# Patient Record
Sex: Female | Born: 1995 | Race: White | Hispanic: No | Marital: Single | Smoking: Never smoker
Health system: Southern US, Community
[De-identification: ages and names within clinical notes are randomized; demographics above are authoritative.]

## PROBLEM LIST (undated history)

## (undated) DIAGNOSIS — F329 Major depressive disorder, single episode, unspecified: Secondary | ICD-10-CM

## (undated) DIAGNOSIS — F988 Other specified behavioral and emotional disorders with onset usually occurring in childhood and adolescence: Secondary | ICD-10-CM

## (undated) DIAGNOSIS — F79 Unspecified intellectual disabilities: Secondary | ICD-10-CM

## (undated) DIAGNOSIS — F4010 Social phobia, unspecified: Secondary | ICD-10-CM

## (undated) DIAGNOSIS — F419 Anxiety disorder, unspecified: Secondary | ICD-10-CM

## (undated) DIAGNOSIS — F32A Depression, unspecified: Secondary | ICD-10-CM

## (undated) DIAGNOSIS — Z23 Encounter for immunization: Secondary | ICD-10-CM

## (undated) HISTORY — PX: WISDOM TOOTH EXTRACTION: SHX21

## (undated) HISTORY — DX: Social phobia, unspecified: F40.10

## (undated) HISTORY — DX: Anxiety disorder, unspecified: F41.9

## (undated) HISTORY — DX: Encounter for immunization: Z23

## (undated) HISTORY — DX: Depression, unspecified: F32.A

## (undated) HISTORY — DX: Major depressive disorder, single episode, unspecified: F32.9

## (undated) HISTORY — DX: Unspecified intellectual disabilities: F79

## (undated) HISTORY — PX: APPENDECTOMY: SHX54

## (undated) HISTORY — DX: Other specified behavioral and emotional disorders with onset usually occurring in childhood and adolescence: F98.8

---
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
## 2015-02-02 ENCOUNTER — Encounter: Payer: Self-pay | Admitting: Emergency Medicine

## 2015-02-02 ENCOUNTER — Emergency Department
Admission: EM | Admit: 2015-02-02 | Discharge: 2015-02-02 | Disposition: A | Discharge status: Home / Self Care | Payer: Medicaid Other | Attending: Emergency Medicine | Admitting: Emergency Medicine

## 2015-02-02 DIAGNOSIS — Z3202 Encounter for pregnancy test, result negative: Secondary | ICD-10-CM | POA: Insufficient documentation

## 2015-02-02 LAB — POCT PREGNANCY, URINE: PREG TEST UR: NEGATIVE

## 2015-02-02 NOTE — ED Provider Notes (Signed)
Shannon Baker  ____________________________________________  Time seen: Approximately 11:32 PM  I have reviewed the triage vital signs and the nursing notes.   HISTORY  Chief Complaint Possible Pregnancy    HPI Shannon Baker is a 19 y.o. female who presents emergency department desiring a pregnancy test. She states that she took a home pregnancy test which was positive and would like retesting here.The patient reports that she has the Implanon which is been in place since March. This was a replacement for her previous Implanon. She states that she has only had 2 menstrual cycles since March and cannot remember last period. She states that she has unprotected sex and took a routine pregnancy test. The pregnancy test at home was positive and she wanted a repeat testing here to confirm. Patient denies any abdominal pain, nausea vomiting, vaginal discharge, dysuria, polyuria, hematuria, flank pain, fevers or chills.   History reviewed. No pertinent past medical history.  There are no active problems to display for this patient.   Past Surgical History  Procedure Laterality Date  . Appendectomy      No current outpatient prescriptions on file.  Allergies Review of patient's allergies indicates no known allergies.  No family history on file.  Social History Social History  Substance Use Topics  . Smoking status: Never Smoker   . Smokeless tobacco: None  . Alcohol Use: No    Review of Systems Constitutional: No fever/chills Eyes: No visual changes. ENT: No sore throat. Cardiovascular: Denies chest pain. Respiratory: Denies shortness of breath. Gastrointestinal: No abdominal pain.  No nausea, no vomiting.  No diarrhea.  No constipation. Genitourinary: Negative for dysuria. Musculoskeletal: Negative for back pain. Skin: Negative for rash. Neurological: Negative for headaches, focal weakness or  numbness.  10-point ROS otherwise negative.  ____________________________________________   PHYSICAL EXAM:  VITAL SIGNS: ED Triage Vitals  Enc Vitals Group     BP 02/02/15 2250 131/89 mmHg     Pulse Rate 02/02/15 2250 103     Resp 02/02/15 2250 20     Temp 02/02/15 2250 99.2 F (37.3 C)     Temp Source 02/02/15 2250 Oral     SpO2 02/02/15 2250 100 %     Weight 02/02/15 2250 133 lb (60.328 kg)     Height 02/02/15 2250  (1.626 m)     Head Cir --      Peak Flow --      Pain Score --      Pain Loc --      Pain Edu? --      Excl. in GC? --     Constitutional: Alert and oriented. Well appearing and in no acute distress. Eyes: Conjunctivae are normal. PERRL. EOMI. Head: Atraumatic. Nose: No congestion/rhinnorhea. Mouth/Throat: Mucous membranes are moist.  Oropharynx non-erythematous. Neck: No stridor.   Cardiovascular: Normal rate, regular rhythm. Grossly normal heart sounds.  Good peripheral circulation. Respiratory: Normal respiratory effort.  No retractions. Lungs CTAB. Gastrointestinal: Soft and nontender. No distention. No abdominal bruits. No CVA tenderness. Musculoskeletal: No lower extremity tenderness nor edema.  No joint effusions. Neurologic:  Normal speech and language. No gross focal neurologic deficits are appreciated. No gait instability. Skin:  Skin is warm, dry and intact. No rash noted. Psychiatric: Mood and affect are normal. Speech and behavior are normal.  ____________________________________________   LABS (all labs ordered are listed, but only abnormal results are displayed)  Labs Reviewed  POCT PREGNANCY, URINE  ____________________________________________  EKG   ____________________________________________  RADIOLOGY   ____________________________________________   PROCEDURES  Procedure(s) performed: None  Critical Care performed: No  ____________________________________________   INITIAL IMPRESSION / ASSESSMENT AND PLAN  / ED COURSE  Pertinent labs & imaging results that were available during my care of the patient were reviewed by me and considered in my medical decision making (see chart for details).  The patient's history, symptoms, physical exam were taken into consideration as well as laboratory results. The patient is informed that pregnancy test here in the emergency department is negative. The patient is to follow-up with OB/GYN for any further concerns. Patient denies any other symptoms. Patient discharged home. ____________________________________________   FINAL CLINICAL IMPRESSION(S) / ED DIAGNOSES  Final diagnoses:  Encounter for pregnancy test with result negative      Shannon PatchesJonathan D Macel Yearsley, PA-C 02/02/15 [id number]  Phineas SemenGraydon Goodman, MD 02/03/15 [phone]

## 2015-02-02 NOTE — Discharge Instructions (Signed)
Contraceptive Implant Information  A contraceptive implant is a plastic rod that is inserted under your skin. It is usually inserted under the skin of your upper arm. It continually releases small amounts of progestin (synthetic progesterone) into your bloodstream. This prevents an egg from being released from your ovaries. It also thickens your cervical mucus to prevent sperm from entering the cervix, and it thins your uterine lining to prevent a fertilized egg from attaching to your uterus. Contraceptive implants can be effective for up to 3 years. They do not provide protection against sexually transmitted diseases (STDs).   The procedure to insert an implant usually takes about 10 minutes. There may be minor bruising, swelling, and discomfort at the insertion site for a couple days. The implant begins to work within the first day. Other contraceptive protection may be necessary for 7 days. Be sure to discuss with your health care provider if you need a backup method of contraception.   Your health care provider will make sure you are a good candidate for the contraceptive implant. Discuss with your health care provider the possible side effects of the implant.  ADVANTAGES  · It prevents pregnancy for up to 3 years.  · It is easily reversible.  · It is convenient.  · It can be used when breastfeeding.  · It can be used by women who cannot take estrogen.  DISADVANTAGES  · You may have irregular or unplanned vaginal bleeding.  · You may develop side effects, including headache, weight gain, acne, breast tenderness, or mood changes.  · You may have tissue or nerve damage after insertion (rare).  · It may be difficult and uncomfortable to remove.  · Certain medicines may interfere with the effectiveness of the implant.   REMOVAL OF IMPLANT  The implant should be removed in 3 years or as directed by your health care provider. The implant's effect wears off in a few hours after removal. Your ability to get pregnant  (fertility) may be restored in 1-2 weeks. A new implant can be inserted as soon as the old one is removed if desired.  CONTRAINDICATIONS  You should not get the implant if you are experiencing any of the following situations:  · You are pregnant.  · You have a history of breast cancer, osteoporosis, blood clots, heart disease, diabetes, high blood pressure, liver disease, tumors, or stroke.    · You have undiagnosed vaginal bleeding.  · You have a sensitivity to any part of the implant.     This information is not intended to replace advice given to you by your health care provider. Make sure you discuss any questions you have with your health care provider.     Document Released: 02/23/2011 Document Revised: 11/06/2012 Document Reviewed: 09/02/2012  Elsevier Interactive Patient Education ©2016 [employer]

## 2015-02-02 NOTE — ED Notes (Signed)
Pt dc home ambulatory denies pain instructed on follow up plan PT NAD AT DC 

## 2015-02-02 NOTE — ED Notes (Signed)
Patient ambulatory to triage with steady gait, without difficulty or distress noted; pt reports wanting a pregnancy test; st took one at home that was positive and wants another; denies any c/o; pt informed that the testing we do here is the same as her home pregnancy test but cont to st she would like another

## 2016-01-02 ENCOUNTER — Emergency Department
Admission: EM | Admit: 2016-01-02 | Discharge: 2016-01-02 | Disposition: A | Discharge status: Home / Self Care | Payer: Medicaid Other | Attending: Emergency Medicine | Admitting: Emergency Medicine

## 2016-01-02 DIAGNOSIS — R300 Dysuria: Secondary | ICD-10-CM | POA: Diagnosis present

## 2016-01-02 DIAGNOSIS — N12 Tubulo-interstitial nephritis, not specified as acute or chronic: Secondary | ICD-10-CM | POA: Insufficient documentation

## 2016-01-02 LAB — URINALYSIS COMPLETE WITH MICROSCOPIC (ARMC ONLY)
Bilirubin Urine: NEGATIVE
GLUCOSE, UA: NEGATIVE mg/dL
Hgb urine dipstick: NEGATIVE
Ketones, ur: NEGATIVE mg/dL
Nitrite: NEGATIVE
Protein, ur: 30 mg/dL — AB
Specific Gravity, Urine: 1.019 (ref 1.005–1.030)
pH: 6 (ref 5.0–8.0)

## 2016-01-02 LAB — BASIC METABOLIC PANEL
ANION GAP: 4 — AB (ref 5–15)
BUN: 15 mg/dL (ref 6–20)
CHLORIDE: 110 mmol/L (ref 101–111)
CO2: 25 mmol/L (ref 22–32)
Calcium: 9.1 mg/dL (ref 8.9–10.3)
Creatinine, Ser: 0.89 mg/dL (ref 0.44–1.00)
Glucose, Bld: 114 mg/dL — ABNORMAL HIGH (ref 65–99)
POTASSIUM: 3.7 mmol/L (ref 3.5–5.1)
SODIUM: 139 mmol/L (ref 135–145)

## 2016-01-02 LAB — CBC
HEMATOCRIT: 42.1 % (ref 35.0–47.0)
Hemoglobin: 14.8 g/dL (ref 12.0–16.0)
MCH: 31.9 pg (ref 26.0–34.0)
MCHC: 35.2 g/dL (ref 32.0–36.0)
MCV: 90.6 fL (ref 80.0–100.0)
Platelets: 268 10*3/uL (ref 150–440)
RBC: 4.64 MIL/uL (ref 3.80–5.20)
RDW: 12.8 % (ref 11.5–14.5)
WBC: 9.9 10*3/uL (ref 3.6–11.0)

## 2016-01-02 LAB — POCT PREGNANCY, URINE: Preg Test, Ur: NEGATIVE

## 2016-01-02 MED ORDER — ONDANSETRON HCL 4 MG/2ML IJ SOLN
4.0000 mg | Freq: Once | INTRAMUSCULAR | Status: AC
  Administered 2016-01-02: 4 mg via INTRAVENOUS
  Filled 2016-01-02: qty 2

## 2016-01-02 MED ORDER — SODIUM CHLORIDE 0.9 % IV BOLUS (SEPSIS)
1000.0000 mL | Freq: Once | INTRAVENOUS | Status: AC
  Administered 2016-01-02: 1000 mL via INTRAVENOUS

## 2016-01-02 MED ORDER — KETOROLAC TROMETHAMINE 30 MG/ML IJ SOLN
30.0000 mg | Freq: Once | INTRAMUSCULAR | Status: AC
  Administered 2016-01-02: 30 mg via INTRAVENOUS
  Filled 2016-01-02: qty 1

## 2016-01-02 MED ORDER — SULFAMETHOXAZOLE-TRIMETHOPRIM 800-160 MG PO TABS: 1.0000 | ORAL_TABLET | Freq: Two times a day (BID) | ORAL | 0 refills | Status: AC

## 2016-01-02 MED ORDER — CEFTRIAXONE SODIUM 1 G IJ SOLR
1.0000 g | Freq: Once | INTRAMUSCULAR | Status: DC
  Filled 2016-01-02: qty 10

## 2016-01-02 MED ORDER — DEXTROSE 5 % IV SOLN: 1.0000 g | Freq: Once | INTRAVENOUS | Status: DC

## 2016-01-02 MED ORDER — CEFTRIAXONE SODIUM-DEXTROSE 1-3.74 GM-% IV SOLR
1.0000 g | Freq: Once | INTRAVENOUS | Status: AC
  Administered 2016-01-02: 1 g via INTRAVENOUS

## 2016-01-02 MED ORDER — CEFTRIAXONE SODIUM-DEXTROSE 1-3.74 GM-% IV SOLR
INTRAVENOUS | Status: AC
  Administered 2016-01-02: 1 g via INTRAVENOUS
  Filled 2016-01-02: qty 50

## 2016-01-02 NOTE — ED Notes (Signed)
Pt states flank pain x 2 weeks - with cloudy urine. Denies dysuria an frequency

## 2016-01-02 NOTE — ED Triage Notes (Signed)
Pt c/o right flank pain with painful urination for the past 3 weeks, states intermittent skant blood with urination.

## 2016-01-02 NOTE — ED Provider Notes (Signed)
[facility] Emergency Department Provider Note  ____________________________________________  Time seen: Approximately 4:53 PM  I have reviewed the triage vital signs and the nursing notes.   HISTORY  Chief Complaint Flank Pain   HPI Shannon Baker Debroah Loop is a 20 y.o. female with no significant past medical history who presents for evaluation of right flank pain and dysuria. Patient reports 4 weeks of dysuria and frequency and now with 2 weeks of right flank pain. Today she started having multiple episodes of nonbloody nonbilious emesis which prompted her to come to the emergency room. She currently endorses 8 out of 10 to a pain located in her right flank that is constant and nonradiating. She is also endorsing mild suprapubic pain. She denies any prior history of kidney stones. She has had no fever, no chills, no hematuria, no vaginal discharge, no vaginal bleeding.  History reviewed. No pertinent past medical history.  There are no active problems to display for this patient.   Past Surgical History:  Procedure Laterality Date  . APPENDECTOMY      Prior to Admission medications   Medication Sig Start Date End Date Taking? Authorizing Provider  sulfamethoxazole-trimethoprim (BACTRIM DS,SEPTRA DS) 800-160 MG tablet Take 1 tablet by mouth 2 (two) times daily. 01/02/16 01/12/16  Nita Sickle, MD    Allergies Review of patient's allergies indicates no known allergies.  No family history on file.  Social History Social History  Substance Use Topics  . Smoking status: Never Smoker  . Smokeless tobacco: Never Used  . Alcohol use No    Review of Systems  Constitutional: Negative for fever. Eyes: Negative for visual changes. ENT: Negative for sore throat. Cardiovascular: Negative for chest pain. Respiratory: Negative for shortness of breath. Gastrointestinal: Negative for abdominal pain,  Diarrhea. + vomiting Genitourinary: + dysuria and R  flank pain Musculoskeletal: Negative for back pain. Skin: Negative for rash. Neurological: Negative for headaches, weakness or numbness.  ____________________________________________   PHYSICAL EXAM:  VITAL SIGNS: ED Triage Vitals  Enc Vitals Group     BP 01/02/16 1455 (!) 141/90     Pulse Rate 01/02/16 1455 91     Resp 01/02/16 1455 16     Temp 01/02/16 1455 97.7 F (36.5 C)     Temp Source 01/02/16 1455 Oral     SpO2 01/02/16 1455 99 %     Weight 01/02/16 1455 145 lb (65.8 kg)     Height 01/02/16 1455 5\' 4"  (1.626 m)     Head Circumference --      Peak Flow --      Pain Score 01/02/16 1456 6     Pain Loc --      Pain Edu? --      Excl. in GC? --     Constitutional: Alert and oriented. Well appearing and in no apparent distress. HEENT:      Head: Normocephalic and atraumatic.         Eyes: Conjunctivae are normal. Sclera is non-icteric. EOMI. PERRL      Mouth/Throat: Mucous membranes are moist.       Neck: Supple with no signs of meningismus. Cardiovascular: Regular rate and rhythm. No murmurs, gallops, or rubs. 2+ symmetrical distal pulses are present in all extremities. No JVD. Respiratory: Normal respiratory effort. Lungs are clear to auscultation bilaterally. No wheezes, crackles, or rhonchi.  Gastrointestinal: Soft, mild suprapubic ttp, and non distended with positive bowel sounds. No rebound or guarding. Genitourinary: Mild R CVA tenderness. Musculoskeletal:  Nontender with normal range of motion in all extremities. No edema, cyanosis, or erythema of extremities. Neurologic: Normal speech and language. Face is symmetric. Moving all extremities. No gross focal neurologic deficits are appreciated. Skin: Skin is warm, dry and intact. No rash noted. Psychiatric: Mood and affect are normal. Speech and behavior are normal.  ____________________________________________   LABS (all labs ordered are listed, but only abnormal results are displayed)  Labs Reviewed    URINALYSIS COMPLETEWITH MICROSCOPIC ([facility] ONLY) - Abnormal; Notable for the following:       Result Value   Color, Urine YELLOW (*)    APPearance CLOUDY (*)    Protein, ur 30 (*)    Leukocytes, UA 3+ (*)    Bacteria, UA RARE (*)    Squamous Epithelial / LPF 6-30 (*)    All other components within normal limits  BASIC METABOLIC PANEL - Abnormal; Notable for the following:    Glucose, Bld 114 (*)    Anion gap 4 (*)    All other components within normal limits  CBC  POC URINE PREG, ED  POCT PREGNANCY, URINE   ____________________________________________  EKG  none  ____________________________________________  RADIOLOGY  none  ____________________________________________   PROCEDURES  Procedure(s) performed: None Procedures Critical Care performed:  None ____________________________________________   INITIAL IMPRESSION / ASSESSMENT AND PLAN / ED COURSE   20 y.o. female with no significant past medical history who presents for evaluation of dysuria x 4 week, R flank pain x 2 weeks and N/V starting today. Patient is afebrile, has a normal white count, vital signs are within normal limits, UA concerning for urinary tract infection. Exam showing mild right flank tenderness and suprapubic tenderness consistent with early pyelonephritis. No evidence of sepsis. We'll give her IV fluids, IV Toradol, IV ceftriaxone, and IV Zofran and we'll discharge home on bactrim   Clinical Course    Pertinent labs & imaging results that were available during my care of the patient were reviewed by me and considered in my medical decision making (see chart for details).    ____________________________________________   FINAL CLINICAL IMPRESSION(S) / ED DIAGNOSES  Final diagnoses:  Pyelonephritis      NEW MEDICATIONS STARTED DURING THIS VISIT:  New Prescriptions   SULFAMETHOXAZOLE-TRIMETHOPRIM (BACTRIM DS,SEPTRA DS) 800-160 MG TABLET    Take 1 tablet by mouth 2 (two) times  daily.     Note:  This document was prepared using Dragon voice recognition software and may include unintentional dictation errors.    Nita Sicklearolina Phylis Javed, MD 01/02/16 [phone]

## 2016-04-25 LAB — HM PAP SMEAR: HM Pap smear: NORMAL

## 2017-04-26 ENCOUNTER — Ambulatory Visit: Payer: Self-pay | Admitting: Obstetrics and Gynecology

## 2017-05-02 ENCOUNTER — Encounter: Payer: Self-pay | Admitting: Obstetrics and Gynecology

## 2017-05-02 ENCOUNTER — Ambulatory Visit (INDEPENDENT_AMBULATORY_CARE_PROVIDER_SITE_OTHER): Payer: Medicaid Other | Admitting: Obstetrics and Gynecology

## 2017-05-02 VITALS — BP 116/80 | Ht 65.0 in | Wt 142.0 lb

## 2017-05-02 DIAGNOSIS — Z0000 Encounter for general adult medical examination without abnormal findings: Secondary | ICD-10-CM

## 2017-05-02 DIAGNOSIS — Z113 Encounter for screening for infections with a predominantly sexual mode of transmission: Secondary | ICD-10-CM

## 2017-05-02 DIAGNOSIS — Z124 Encounter for screening for malignant neoplasm of cervix: Secondary | ICD-10-CM | POA: Diagnosis not present

## 2017-05-02 DIAGNOSIS — Z01419 Encounter for gynecological examination (general) (routine) without abnormal findings: Secondary | ICD-10-CM

## 2017-05-02 DIAGNOSIS — Z3046 Encounter for surveillance of implantable subdermal contraceptive: Secondary | ICD-10-CM

## 2017-05-02 NOTE — Progress Notes (Signed)
PCP:  Patient, No Pcp Per   Chief Complaint  Patient presents with  . Annual Exam     HPI:      Ms. Shannon Baker is a 22 y.o. G0P0000 who LMP was Patient's last menstrual period was 04/28/2017., presents today for her annual examination.  Her menses are irregular with nexplanon. Can be several times a month, lasting a few days, light flow. Dysmenorrhea mild, occurring first 1-2 days of flow.   Sex activity: single partner, contraception - nexplanon, placed 3/16. Pt would like another one. Last Pap: April 25, 2016  Results were: no abnormalities  Hx of STDs: none  There is no FH of breast cancer. There is no FH of ovarian cancer. The patient does not do self-breast exams.  Tobacco use: The patient denies current or previous tobacco use. Alcohol use: none No drug use.  Exercise: not active  She does not get adequate calcium and Vitamin D in her diet.  Gardasil completed.   Past Medical History:  Diagnosis Date  . ADD (attention deficit disorder)   . Anxiety   . Depression   . Mental retardation   . Social anxiety disorder   . Vaccine for human papilloma virus (HPV) types 6, 11, 16, and 18 administered     Past Surgical History:  Procedure Laterality Date  . APPENDECTOMY    . WISDOM TOOTH EXTRACTION      Family History  Problem Relation Age of Onset  . Throat cancer Maternal Uncle   . Throat cancer Maternal Grandmother   . Colon cancer Maternal Grandfather [id number]    Social History   Socioeconomic History  . Marital status: Single    Spouse name: Not on file  . Number of children: Not on file  . Years of education: Not on file  . Highest education level: Not on file  Social Needs  . Financial resource strain: Not on file  . Food insecurity - worry: Not on file  . Food insecurity - inability: Not on file  . Transportation needs - medical: Not on file  . Transportation needs - non-medical: Not on file  Occupational History  . Not on file  Tobacco  Use  . Smoking status: Never Smoker  . Smokeless tobacco: Never Used  Substance and Sexual Activity  . Alcohol use: No  . Drug use: Not on file  . Sexual activity: Yes    Birth control/protection: Implant  Other Topics Concern  . Not on file  Social History Narrative  . Not on file    Current Meds  Medication Sig  . etonogestrel (NEXPLANON) 68 MG IMPL implant 1 each by Subdermal route once.     ROS:  Review of Systems  Constitutional: Negative for fatigue, fever and unexpected weight change.  Respiratory: Negative for cough, shortness of breath and wheezing.   Cardiovascular: Negative for chest pain, palpitations and leg swelling.  Gastrointestinal: Positive for constipation. Negative for blood in stool, diarrhea, nausea and vomiting.  Endocrine: Negative for cold intolerance, heat intolerance and polyuria.  Genitourinary: Negative for dyspareunia, dysuria, flank pain, frequency, genital sores, hematuria, menstrual problem, pelvic pain, urgency, vaginal bleeding, vaginal discharge and vaginal pain.  Musculoskeletal: Negative for back pain, joint swelling and myalgias.  Skin: Negative for rash.  Neurological: Negative for dizziness, syncope, light-headedness, numbness and headaches.  Hematological: Negative for adenopathy.  Psychiatric/Behavioral: Positive for agitation and dysphoric mood. Negative for confusion, sleep disturbance and suicidal ideas. The patient is not nervous/anxious.  Objective: BP 116/80   Ht 5\' 5"  (1.651 m)   Wt 142 lb (64.4 kg)   LMP 04/28/2017   BMI 23.63 kg/m    Physical Exam  Constitutional: She is oriented to person, place, and time. She appears well-developed and well-nourished.  Genitourinary: Vagina normal and uterus normal. There is no rash or tenderness on the right labia. There is no rash or tenderness on the left labia. No erythema or tenderness in the vagina. No vaginal discharge found. Right adnexum does not display mass and does  not display tenderness. Left adnexum does not display mass and does not display tenderness. Cervix does not exhibit motion tenderness or polyp. Uterus is not enlarged or tender.  Neck: Normal range of motion. No thyromegaly present.  Cardiovascular: Normal rate, regular rhythm and normal heart sounds.  No murmur heard. Pulmonary/Chest: Effort normal and breath sounds normal. Right breast exhibits no mass, no nipple discharge, no skin change and no tenderness. Left breast exhibits no mass, no nipple discharge, no skin change and no tenderness.  Abdominal: Soft. There is no tenderness. There is no guarding.  Musculoskeletal: Normal range of motion.  Neurological: She is alert and oriented to person, place, and time. No cranial nerve deficit.  Psychiatric: She has a normal mood and affect. Her behavior is normal.  Vitals reviewed.   Assessment/Plan: Encounter for annual routine gynecological examination  Cervical cancer screening - Plan: IGP,CtNgTv,rfx Aptima HPV ASCU  Screening for STD (sexually transmitted disease) - Plan: IGP,CtNgTv,rfx Aptima HPV ASCU  Encounter for surveillance of implantable subdermal contraceptive - Due for rem 3/19. Pt to RTO for removal/reinsertion.     GYN counsel STD prevention, adequate intake of calcium and vitamin D, diet and exercise     F/U  Return in about 1 week (around 05/09/2017) for nexplanon removal/replacement.  Alicia B. Copland, PA-C 05/02/2017 4:04 PM

## 2017-05-02 NOTE — Patient Instructions (Signed)
I value your feedback and entrusting us with your care. If you get a [facility] patient survey, I would appreciate you taking the time to let us know about your experience today. Thank you! 

## 2017-05-05 LAB — IGP,CTNGTV,RFX APTIMA HPV ASCU
CHLAMYDIA, NUC. ACID AMP: NEGATIVE
Gonococcus, Nuc. Acid Amp: NEGATIVE
PAP SMEAR COMMENT: 0
Trich vag by NAA: NEGATIVE

## 2017-05-08 ENCOUNTER — Encounter: Payer: Self-pay | Admitting: Obstetrics and Gynecology

## 2017-05-10 ENCOUNTER — Encounter: Payer: Self-pay | Admitting: Obstetrics and Gynecology

## 2017-05-10 ENCOUNTER — Telehealth: Payer: Self-pay | Admitting: Obstetrics and Gynecology

## 2017-05-10 NOTE — Telephone Encounter (Signed)
Pt is schedule for nexplanon removal and reinsertion for 06/01/17 with ABC

## 2017-05-15 NOTE — Telephone Encounter (Signed)
Noted. Will order to arrive by apt date/time. 

## 2017-06-01 ENCOUNTER — Other Ambulatory Visit: Payer: Self-pay

## 2017-06-01 ENCOUNTER — Encounter: Payer: Self-pay | Admitting: Obstetrics and Gynecology

## 2017-06-01 ENCOUNTER — Ambulatory Visit: Payer: Medicaid Other | Admitting: Obstetrics and Gynecology

## 2017-06-01 VITALS — BP 106/74 | HR 76 | Ht 65.0 in | Wt 142.0 lb

## 2017-06-01 DIAGNOSIS — Z3049 Encounter for surveillance of other contraceptives: Secondary | ICD-10-CM | POA: Diagnosis not present

## 2017-06-01 DIAGNOSIS — Z3046 Encounter for surveillance of implantable subdermal contraceptive: Secondary | ICD-10-CM

## 2017-06-01 DIAGNOSIS — Z30017 Encounter for initial prescription of implantable subdermal contraceptive: Secondary | ICD-10-CM

## 2017-06-01 NOTE — Progress Notes (Signed)
   Chief Complaint  Patient presents with  . Contraception    Nexplanon Removal/Reinsert     History of Present Illness:  Shannon Baker is a 22 y.o. that had a nexplanon placed approximately 3 years  ago. Since that time, she has done well with it and needs a replacement.    Nexplanon removal Procedure note - The Nexplanon was noted in the patient's arm and the end was identified. The skin was cleansed with a Betadine solution. A small injection of subcutaneous lidocaine with epinephrine was given over the end of the implant. An incision was made at the end of the implant. The rod was noted in the incision and grasped with a hemostat. It was noted to be intact.  Steri-Strip was placed approximating the incision. Hemostasis was noted.  Nexplanon Insertion  Patient given informed consent, signed copy in the chart, time out was performed.  Appropriate time out taken.  Patient's LEFT arm was prepped and draped in the usual sterile fashion. The ruler used to measure and mark insertion area.  Pt was prepped with betadine swab and then injected with 1.0cc of 2% lidocaine with epinephrine. Nexplanon removed form packaging,  Device confirmed in needle, then inserted full length of needle and withdrawn per handbook instructions.  Pt insertion site covered with steri-strip and a bandage.   Minimal blood loss.  Pt tolerated the procedure welL.  Assessment: Nexplanon removal  Nexplanon insertion  Plan:   She was told to remove the dressing in 12-24 hours, to keep the incision area dry for 24 hours and to remove the Steristrip in 2-3  days.  Notify us if any signs of tenderness, redness, pain, or fevers develop.  Shannon Benassi B. Avenell Sellers, PA-C 06/01/2017 10:22 AM

## 2017-06-01 NOTE — Patient Instructions (Addendum)
I value your feedback and entrusting us with your care. If you get a [facility] patient survey, I would appreciate you taking the time to let us know about your experience today. Thank you!  Remove the dressing in 12-24 hours,  keep the incision area dry for 24 hours and remove the Steristrip in 2-3  days.  Notify us if any signs of tenderness, redness, pain, or fevers develop. 

## 2017-06-08 NOTE — Telephone Encounter (Signed)
Nexplanon received 06/01/17

## 2017-08-02 ENCOUNTER — Encounter: Payer: Self-pay | Admitting: Emergency Medicine

## 2017-08-02 ENCOUNTER — Emergency Department
Admission: EM | Admit: 2017-08-02 | Discharge: 2017-08-03 | Disposition: A | Discharge status: Home / Self Care | Payer: BLUE CROSS/BLUE SHIELD | Attending: Emergency Medicine | Admitting: Emergency Medicine

## 2017-08-02 DIAGNOSIS — Z79899 Other long term (current) drug therapy: Secondary | ICD-10-CM | POA: Insufficient documentation

## 2017-08-02 DIAGNOSIS — R112 Nausea with vomiting, unspecified: Secondary | ICD-10-CM | POA: Diagnosis not present

## 2017-08-02 DIAGNOSIS — R110 Nausea: Secondary | ICD-10-CM

## 2017-08-02 DIAGNOSIS — F79 Unspecified intellectual disabilities: Secondary | ICD-10-CM | POA: Insufficient documentation

## 2017-08-02 DIAGNOSIS — R1030 Lower abdominal pain, unspecified: Secondary | ICD-10-CM | POA: Insufficient documentation

## 2017-08-02 LAB — URINALYSIS, COMPLETE (UACMP) WITH MICROSCOPIC
BACTERIA UA: NONE SEEN
BILIRUBIN URINE: NEGATIVE
GLUCOSE, UA: NEGATIVE mg/dL
HGB URINE DIPSTICK: NEGATIVE
KETONES UR: NEGATIVE mg/dL
Leukocytes, UA: NEGATIVE
NITRITE: NEGATIVE
Protein, ur: NEGATIVE mg/dL
Specific Gravity, Urine: 1.018 (ref 1.005–1.030)
pH: 7 (ref 5.0–8.0)

## 2017-08-02 LAB — COMPREHENSIVE METABOLIC PANEL
ALBUMIN: 4.3 g/dL (ref 3.5–5.0)
ALK PHOS: 69 U/L (ref 38–126)
ALT: 12 U/L — ABNORMAL LOW (ref 14–54)
ANION GAP: 8 (ref 5–15)
AST: 19 U/L (ref 15–41)
BUN: 8 mg/dL (ref 6–20)
CHLORIDE: 109 mmol/L (ref 101–111)
CO2: 21 mmol/L — AB (ref 22–32)
Calcium: 8.8 mg/dL — ABNORMAL LOW (ref 8.9–10.3)
Creatinine, Ser: 0.72 mg/dL (ref 0.44–1.00)
GFR calc Af Amer: >60 mL/min (ref >60)
GFR calc non Af Amer: >60 mL/min (ref >60)
GLUCOSE: 95 mg/dL (ref 65–99)
POTASSIUM: 3.8 mmol/L (ref 3.5–5.1)
SODIUM: 138 mmol/L (ref 135–145)
Total Bilirubin: 0.7 mg/dL (ref 0.3–1.2)
Total Protein: 8.2 g/dL — ABNORMAL HIGH (ref 6.5–8.1)

## 2017-08-02 LAB — CBC
HEMATOCRIT: 43.5 % (ref 35.0–47.0)
HEMOGLOBIN: 15.1 g/dL (ref 12.0–16.0)
MCH: 32.1 pg (ref 26.0–34.0)
MCHC: 34.8 g/dL (ref 32.0–36.0)
MCV: 92.3 fL (ref 80.0–100.0)
Platelets: 290 10*3/uL (ref 150–440)
RBC: 4.71 MIL/uL (ref 3.80–5.20)
RDW: 12.8 % (ref 11.5–14.5)
WBC: 8.8 10*3/uL (ref 3.6–11.0)

## 2017-08-02 LAB — LIPASE, BLOOD: LIPASE: 53 U/L — AB (ref 11–51)

## 2017-08-02 LAB — POCT PREGNANCY, URINE: PREG TEST UR: NEGATIVE

## 2017-08-02 NOTE — Discharge Instructions (Signed)
It was a pleasure to take care of you today, and thank you for coming to our emergency department.  If you have any questions or concerns before leaving please ask the nurse to grab me and I'm more than happy to go through your aftercare instructions again.  If you were prescribed any opioid pain medication today such as Norco, Vicodin, Percocet, morphine, hydrocodone, or oxycodone please make sure you do not drive when you are taking this medication as it can alter your ability to drive safely.  If you have any concerns once you are home that you are not improving or are in fact getting worse before you can make it to your follow-up appointment, please do not hesitate to call 911 and come back for further evaluation.  Merrily Brittle, MD  Results for orders placed or performed during the hospital encounter of 08/02/17  Lipase, blood  Result Value Ref Range   Lipase 53 (H) 11 - 51 U/L  Comprehensive metabolic panel  Result Value Ref Range   Sodium 138 135 - 145 mmol/L   Potassium 3.8 3.5 - 5.1 mmol/L   Chloride 109 101 - 111 mmol/L   CO2 21 (L) 22 - 32 mmol/L   Glucose, Bld 95 65 - 99 mg/dL   BUN 8 6 - 20 mg/dL   Creatinine, Ser 1.61 0.44 - 1.00 mg/dL   Calcium 8.8 (L) 8.9 - 10.3 mg/dL   Total Protein 8.2 (H) 6.5 - 8.1 g/dL   Albumin 4.3 3.5 - 5.0 g/dL   AST 19 15 - 41 U/L   ALT 12 (L) 14 - 54 U/L   Alkaline Phosphatase 69 38 - 126 U/L   Total Bilirubin 0.7 0.3 - 1.2 mg/dL   GFR calc non Af Amer >60 >60 mL/min   GFR calc Af Amer >60 >60 mL/min   Anion gap 8 5 - 15  CBC  Result Value Ref Range   WBC 8.8 3.6 - 11.0 K/uL   RBC 4.71 3.80 - 5.20 MIL/uL   Hemoglobin 15.1 12.0 - 16.0 g/dL   HCT 09.6 04.5 - 40.9 %   MCV 92.3 80.0 - 100.0 fL   MCH 32.1 26.0 - 34.0 pg   MCHC 34.8 32.0 - 36.0 g/dL   RDW 81.1 91.4 - 78.2 %   Platelets 290 150 - 440 K/uL  Urinalysis, Complete w Microscopic  Result Value Ref Range   Color, Urine YELLOW (A) YELLOW   APPearance CLEAR (A) CLEAR   Specific  Gravity, Urine 1.018 1.005 - 1.030   pH 7.0 5.0 - 8.0   Glucose, UA NEGATIVE NEGATIVE mg/dL   Hgb urine dipstick NEGATIVE NEGATIVE   Bilirubin Urine NEGATIVE NEGATIVE   Ketones, ur NEGATIVE NEGATIVE mg/dL   Protein, ur NEGATIVE NEGATIVE mg/dL   Nitrite NEGATIVE NEGATIVE   Leukocytes, UA NEGATIVE NEGATIVE   RBC / HPF 0-5 0 - 5 RBC/hpf   WBC, UA 0-5 0 - 5 WBC/hpf   Bacteria, UA NONE SEEN NONE SEEN   Squamous Epithelial / LPF 0-5 0 - 5   Mucus PRESENT   Pregnancy, urine POC  Result Value Ref Range   Preg Test, Ur NEGATIVE NEGATIVE

## 2017-08-02 NOTE — ED Triage Notes (Signed)
Patient c/o N/V. Patient reports 1st emesis on last Thursday. Patient reports intermittent lower abdominal pain. Patient's last menstrual period was in the beginning of March. Patient had her Nexplanon changed in March. Patient called her primary care and reports her MD stated "it sounds like you're pregnant.

## 2017-08-02 NOTE — ED Provider Notes (Signed)
[facility] Emergency Department Provider Note  ____________________________________________   First MD Initiated Contact with Patient 08/02/17 2331     (approximate)  I have reviewed the triage vital signs and the nursing notes.   HISTORY  Chief Complaint Emesis   HPI Shannon Baker Debroah Loop is a 22 y.o. female who self presents to the emergency department with nausea and vomiting for the past week or so.  Associated with intermittent mild cramping lower abdominal discomfort.  Her last menstrual period was about 2 months ago and she is concerned she could be pregnant.  She denies vaginal discharge.  She denies dysuria frequency hesitancy or back pain.  She denies fevers or chills.  She has a remote history of appendectomy.  No diarrhea.  No sick contacts.  Her symptoms are currently resolved.  Nothing particular seems to make them come on or go away.  Past Medical History:  Diagnosis Date  . ADD (attention deficit disorder)   . Anxiety   . Depression   . Mental retardation   . Social anxiety disorder   . Vaccine for human papilloma virus (HPV) types 6, 11, 16, and 18 administered     There are no active problems to display for this patient.   Past Surgical History:  Procedure Laterality Date  . APPENDECTOMY    . WISDOM TOOTH EXTRACTION      Prior to Admission medications   Medication Sig Start Date End Date Taking? Authorizing Provider  etonogestrel (NEXPLANON) 68 MG IMPL implant 1 each by Subdermal route once.    [provider]    Allergies Patient has no known allergies.  Family History  Problem Relation Age of Onset  . Throat cancer Maternal Uncle   . Throat cancer Maternal Grandmother   . Colon cancer Maternal Grandfather 45    Social History Social History   Tobacco Use  . Smoking status: Never Smoker  . Smokeless tobacco: Never Used  Substance Use Topics  . Alcohol use: No  . Drug use: Not on file    Review of  Systems Constitutional: No fever/chills Eyes: No visual changes. ENT: No sore throat. Cardiovascular: Denies chest pain. Respiratory: Denies shortness of breath. Gastrointestinal: Positive for abdominal pain.  Positive for nausea, positive for vomiting.  No diarrhea.  No constipation. Genitourinary: Negative for dysuria. Musculoskeletal: Negative for back pain. Skin: Negative for rash. Neurological: Negative for headaches, focal weakness or numbness.   ____________________________________________   PHYSICAL EXAM:  VITAL SIGNS: ED Triage Vitals  Enc Vitals Group     BP 08/02/17 2013 (!) 139/102     Pulse Rate 08/02/17 2013 84     Resp 08/02/17 2013 17     Temp 08/02/17 2013 98.3 F (36.8 C)     Temp Source 08/02/17 2013 Oral     SpO2 08/02/17 2013 100 %     Weight 08/02/17 2014 142 lb (64.4 kg)     Height --      Head Circumference --      Peak Flow --      Pain Score 08/02/17 2014 0     Pain Loc --      Pain Edu? --      Excl. in GC? --     Constitutional: Alert and oriented x4 well-appearing nontoxic no diaphoresis speaks in full clear sentences Eyes: PERRL EOMI. Head: Atraumatic. Nose: No congestion/rhinnorhea. Mouth/Throat: No trismus Neck: No stridor.   Cardiovascular: Normal rate, regular rhythm. Grossly normal heart sounds.  Good peripheral circulation. Respiratory: Normal respiratory effort.  No retractions. Lungs CTAB and moving good air Gastrointestinal: Soft nondistended nontender no rebound or guarding no peritonitis no McBurney's tenderness 6 no costovertebral tenderness Musculoskeletal: No lower extremity edema   Neurologic:  Normal speech and language. No gross focal neurologic deficits are appreciated. Skin:  Skin is warm, dry and intact. No rash noted. Psychiatric: Mood and affect are normal. Speech and behavior are normal.    ____________________________________________   DIFFERENTIAL includes but not limited to  Stump appendicitis,  diverticulitis, pyonephritis, nephrolithiasis, ectopic pregnancy ____________________________________________   LABS (all labs ordered are listed, but only abnormal results are displayed)  Labs Reviewed  LIPASE, BLOOD - Abnormal; Notable for the following components:      Result Value   Lipase 53 (*)    All other components within normal limits  COMPREHENSIVE METABOLIC PANEL - Abnormal; Notable for the following components:   CO2 21 (*)    Calcium 8.8 (*)    Total Protein 8.2 (*)    ALT 12 (*)    All other components within normal limits  URINALYSIS, COMPLETE (UACMP) WITH MICROSCOPIC - Abnormal; Notable for the following components:   Color, Urine YELLOW (*)    APPearance CLEAR (*)    All other components within normal limits  CBC  POCT PREGNANCY, URINE    Lab work reviewed by me with no acute disease specifically she is not pregnant __________________________________________  EKG   ____________________________________________  RADIOLOGY   ____________________________________________   PROCEDURES  Procedure(s) performed: no  Procedures  Critical Care performed: no  Observation: no ____________________________________________   INITIAL IMPRESSION / ASSESSMENT AND PLAN / ED COURSE  Pertinent labs & imaging results that were available during my care of the patient were reviewed by me and considered in my medical decision making (see chart for details).  The patient arrives hemodynamically stable and very well-appearing.  She has a benign abdominal exam.  Her symptoms been for more than a week.  She mostly came today to find out whether or not she was pregnant and she is extremely relieved to find out that she is not.  Had a lengthy discussion with the patient regarding the diagnostic uncertainty but that I did not feel she required advanced imaging at this time.  Strict return precautions have been given and the patient verbalizes understanding and agreement with  the plan.      ____________________________________________   FINAL CLINICAL IMPRESSION(S) / ED DIAGNOSES  Final diagnoses:  Nausea      NEW MEDICATIONS STARTED DURING THIS VISIT:  Discharge Medication List as of 08/02/2017 11:45 PM       Note:  This document was prepared using Dragon voice recognition software and may include unintentional dictation errors.     Merrily Brittle, MD 08/04/17 1106
